# Patient Record
Sex: Female | Born: 2000 | Hispanic: Yes | Marital: Single | Smoking: Never smoker
Health system: Southern US, Community
[De-identification: ages and names within clinical notes are randomized; demographics above are authoritative.]

## PROBLEM LIST (undated history)

## (undated) DIAGNOSIS — I10 Essential (primary) hypertension: Secondary | ICD-10-CM

---

## 2021-07-16 ENCOUNTER — Inpatient Hospital Stay (HOSPITAL_COMMUNITY)
Admission: AD | Admit: 2021-07-16 | Discharge: 2021-07-16 | Disposition: A | Discharge status: Home / Self Care | Payer: Self-pay | Attending: Obstetrics & Gynecology | Admitting: Obstetrics & Gynecology

## 2021-07-16 ENCOUNTER — Ambulatory Visit (HOSPITAL_COMMUNITY)
Admission: EM | Admit: 2021-07-16 | Discharge: 2021-07-16 | Disposition: A | Discharge status: Home / Self Care | Payer: Self-pay | Attending: Emergency Medicine | Admitting: Emergency Medicine

## 2021-07-16 ENCOUNTER — Ambulatory Visit (INDEPENDENT_AMBULATORY_CARE_PROVIDER_SITE_OTHER): Payer: Self-pay

## 2021-07-16 ENCOUNTER — Encounter (HOSPITAL_COMMUNITY): Payer: Self-pay

## 2021-07-16 ENCOUNTER — Other Ambulatory Visit: Payer: Self-pay

## 2021-07-16 DIAGNOSIS — K5904 Chronic idiopathic constipation: Secondary | ICD-10-CM

## 2021-07-16 DIAGNOSIS — R1030 Lower abdominal pain, unspecified: Secondary | ICD-10-CM

## 2021-07-16 DIAGNOSIS — Z3202 Encounter for pregnancy test, result negative: Secondary | ICD-10-CM

## 2021-07-16 DIAGNOSIS — R1012 Left upper quadrant pain: Secondary | ICD-10-CM

## 2021-07-16 HISTORY — DX: Essential (primary) hypertension: I10

## 2021-07-16 LAB — POCT URINALYSIS DIPSTICK, ED / UC
Bilirubin Urine: NEGATIVE
Glucose, UA: NEGATIVE mg/dL
Ketones, ur: NEGATIVE mg/dL
Leukocytes,Ua: NEGATIVE
Nitrite: NEGATIVE
Protein, ur: NEGATIVE mg/dL
Specific Gravity, Urine: >=1.03 (ref 1.005–1.030)
Urobilinogen, UA: 0.2 mg/dL (ref 0.0–1.0)
pH: 6 (ref 5.0–8.0)

## 2021-07-16 LAB — POCT PREGNANCY, URINE: Preg Test, Ur: NEGATIVE

## 2021-07-16 LAB — HCG, QUANTITATIVE, PREGNANCY: hCG, Beta Chain, Quant, S: <1 m[IU]/mL (ref <5)

## 2021-07-16 MED ORDER — POLYETHYLENE GLYCOL 3350 17 G PO PACK: 17.0000 g | PACK | Freq: Every day | ORAL | 0 refills | Status: AC

## 2021-07-16 NOTE — ED Triage Notes (Signed)
Pt reports L lower abdominal pain X 2 weeks.  ? ? ?[facility]. [facility] and had blood work done that tested for pregnancy. Pt denies other pain and sxs.  ?

## 2021-07-16 NOTE — MAU Provider Note (Signed)
S ?[name] is a 21 y.o. No obstetric history on file. female who presents to MAU today with complaint of LAP in setting of +HPT. Reports 1 positive and 2 negative pregnancy test about 10 days ago. LMP was about 6 mos ago. Reports irregular periods since she was younger. ? ?ROS: ?+abd pain ? ?O ?BP 128/64 (BP Location: Right Arm)   Pulse 87   Temp 98 ?F (36.7 ?C) (Oral)   Resp 16   LMP  (LMP Unknown)   SpO2 99% Comment: room air ?Physical Exam ?Vitals and nursing note reviewed.  ?Constitutional:   ?   General: She is not in acute distress (appears comfortable). ?   Appearance: Normal appearance.  ?HENT:  ?   Head: Normocephalic and atraumatic.  ?Cardiovascular:  ?   Rate and Rhythm: Normal rate.  ?Pulmonary:  ?   Effort: Pulmonary effort is normal. No respiratory distress.  ?Musculoskeletal:     ?   General: Normal range of motion.  ?   Cervical back: Normal range of motion.  ?Neurological:  ?   General: No focal deficit present.  ?   Mental Status: She is alert and oriented to person, place, and time.  ?Psychiatric:     ?   Mood and Affect: Mood normal.     ?   Behavior: Behavior normal.  ? ?Results for orders placed or performed during the hospital encounter of 07/16/21 (from the past 24 hour(s))  ?Pregnancy, urine POC     Status: None  ? Collection Time: 07/16/21  3:49 PM  ?Result Value Ref Range  ? Preg Test, Ur NEGATIVE NEGATIVE  ?hCG, quantitative, pregnancy     Status: None  ? Collection Time: 07/16/21  4:05 PM  ?Result Value Ref Range  ? hCG, Beta Chain, Quant, S <1 <5 mIU/mL  ? ?MDM: Labs ordered. Negative UPT, will order qhcg.  ?No signs of pregnancy. Offered transfer to ED or UC, pt is undecided and will discuss with partner. Stable for discharge. ? ?A ?1. Negative pregnancy test   ?2. Lower abdominal pain   ? ?P ?Discharge from MAU in stable condition ?Warning signs for worsening condition that would warrant emergency follow-up discussed ?Patient may return to MAU as needed for  pregnancy related complaints ?Follow up at [facility] as scheduled ? ?Spanish interpreter present for all interactions ?Donette Larry, CNM ?07/16/2021 5:41 PM  ? ?

## 2021-07-16 NOTE — ED Provider Notes (Addendum)
[facility]    CSN: [id number] Arrival date & time: 07/16/21  1747    HISTORY   Chief Complaint  Patient presents with   Abdominal Pain   HPI Kathryn Carroll is a 21 y.o. female. Patient complains of lower abdominal pain for 2 weeks.  Patient was seen at the MAU earlier today for chief complaint of 1 positive pregnancy test and 2 negative pregnancy tests at home.  UPT and serum hCG performed at MAU today were both negative for pregnancy.  Patient reports irregular menstrual cycle, last menstrual period was January 16, 2021.  Patient states she is currently sexually active and not using any form of birth control.  Urine dip today was positive for trace amount of blood.  The history is provided by the patient.  Past Medical History:  Diagnosis Date   Hypertension    There are no problems to display for this patient.  History reviewed. No pertinent surgical history. OB History   No obstetric history on file.    Home Medications    Prior to Admission medications   Not on File    Family History Family History  Problem Relation Age of Onset   Healthy Mother    Healthy Father    Social History Social History   Tobacco Use   Smoking status: Never   Smokeless tobacco: Never  Vaping Use   Vaping Use: Never used  Substance Use Topics   Alcohol use: Yes   Drug use: Never   Allergies   Patient has no known allergies.  Review of Systems Review of Systems Pertinent findings noted in history of present illness.   Physical Exam Triage Vital Signs ED Triage Vitals  Enc Vitals Group     BP 03/14/21 0827 (!) 147/82     Pulse Rate 03/14/21 0827 72     Resp 03/14/21 0827 18     Temp 03/14/21 0827 98.3 F (36.8 C)     Temp Source 03/14/21 0827 Oral     SpO2 03/14/21 0827 98 %     Weight --      Height --      Head Circumference --      Peak Flow --      Pain Score 03/14/21 0826 5     Pain Loc --      Pain Edu? --      Excl. in GC? --   No  data found.  Updated Vital Signs BP (!) 138/93 (BP Location: Left Arm)    Pulse 86    Temp 98 F (36.7 C) (Oral)    Resp 17    LMP 01/16/2021 (Approximate)    SpO2 100%   Physical Exam Vitals and nursing note reviewed.  Constitutional:      General: She is not in acute distress.    Appearance: Normal appearance. She is not ill-appearing.  HENT:     Head: Normocephalic and atraumatic.  Eyes:     General: Lids are normal.        Right eye: No discharge.        Left eye: No discharge.     Extraocular Movements: Extraocular movements intact.     Conjunctiva/sclera: Conjunctivae normal.     Right eye: Right conjunctiva is not injected.     Left eye: Left conjunctiva is not injected.  Neck:     Trachea: Trachea and phonation normal.  Cardiovascular:     Rate and Rhythm: Normal rate and regular rhythm.  Pulses: Normal pulses.     Heart sounds: Normal heart sounds. No murmur heard.   No friction rub. No gallop.  Pulmonary:     Effort: Pulmonary effort is normal. No accessory muscle usage, prolonged expiration or respiratory distress.     Breath sounds: Normal breath sounds. No stridor, decreased air movement or transmitted upper airway sounds. No decreased breath sounds, wheezing, rhonchi or rales.  Chest:     Chest wall: No tenderness.  Abdominal:     General: Abdomen is flat. Bowel sounds are normal. There is no distension.     Palpations: Abdomen is soft.     Tenderness: There is abdominal tenderness in the left upper quadrant and left lower quadrant. There is no right CVA tenderness or left CVA tenderness.     Hernia: No hernia is present.  Musculoskeletal:        General: Normal range of motion.     Cervical back: Normal range of motion and neck supple. Normal range of motion.  Lymphadenopathy:     Cervical: No cervical adenopathy.  Skin:    General: Skin is warm and dry.     Findings: No erythema or rash.  Neurological:     General: No focal deficit present.      Mental Status: She is alert and oriented to person, place, and time.  Psychiatric:        Mood and Affect: Mood normal.        Behavior: Behavior normal.    Visual Acuity Right Eye Distance:   Left Eye Distance:   Bilateral Distance:    Right Eye Near:   Left Eye Near:    Bilateral Near:     UC Couse / Diagnostics / Procedures:    EKG  Radiology DG Abd 1 View  Result Date: 07/16/2021 CLINICAL DATA:  LEFT side lower abdominal pain for 2 weeks, negative pregnancy test EXAM: ABDOMEN - 1 VIEW COMPARISON:  None FINDINGS: Scattered stool throughout colon and rectum. Nonobstructive bowel gas pattern. No bowel dilatation or bowel wall thickening. Osseous structures unremarkable. No urine tract calcification. IMPRESSION: No acute abnormalities. Electronically Signed   By: Ulyses Southward M.D.   On: 07/16/2021 19:35    Procedures Procedures (including critical care time)  UC Diagnoses / Final Clinical Impressions(s)   I have reviewed the triage vital signs and the nursing notes.  Pertinent labs & imaging results that were available during my care of the patient were reviewed by me and considered in my medical decision making (see chart for details).    Final diagnoses:  Chronic idiopathic constipation  Left upper quadrant abdominal pain   Significant stool burden appreciated on plain abdominal film today.  Patient advised to begin MiraLAX.  Prescription sent to pharmacy.  Patient provided with contact information for [facility] because she does not have insurance.  ED Prescriptions     Medication Sig Dispense Auth. Provider   polyethylene glycol (MIRALAX / GLYCOLAX) 17 g packet Take 17 g by mouth daily. 100 packet Theadora Rama Scales, [place]      PDMP not reviewed this encounter.  Pending results:  Labs Reviewed  POCT URINALYSIS DIPSTICK, ED / UC - Abnormal; Notable for the following components:      Result Value   Hgb urine dipstick TRACE (*)    All  other components within normal limits    Medications Ordered in UC: Medications - No data to display  Disposition Upon Discharge:  Condition: stable for  discharge home Home: take medications as prescribed; routine discharge instructions as discussed; follow up as advised.  Patient presented with an acute illness with associated systemic symptoms and significant discomfort requiring urgent management. In my opinion, this is a condition that a prudent lay person (someone who possesses an average knowledge of health and medicine) may potentially expect to result in complications if not addressed urgently such as respiratory distress, impairment of bodily function or dysfunction of bodily organs.   Routine symptom specific, illness specific and/or disease specific instructions were discussed with the patient and/or caregiver at length.   As such, the patient has been evaluated and assessed, work-up was performed and treatment was provided in alignment with urgent care protocols and evidence based medicine.  Patient/parent/caregiver has been advised that the patient may require follow up for further testing and treatment if the symptoms continue in spite of treatment, as clinically indicated and appropriate.  If the patient was tested for COVID-19, Influenza and/or RSV, then the patient/parent/guardian was advised to isolate at home pending the results of his/her diagnostic coronavirus test and potentially longer if theyre positive. I have also advised pt that if his/her COVID-19 test returns positive, it's recommended to self-isolate for at least 10 days after symptoms first appeared AND until fever-free for 24 hours without fever reducer AND other symptoms have improved or resolved. Discussed self-isolation recommendations as well as instructions for household member/close contacts as per the [facility]CDC and [place] DHHS, and also gave patient the COVID packet with this information.  Patient/parent/caregiver has  been [facility] 3-5 days if no better; to PCP or the Emergency Department if new signs and symptoms develop, or if the current signs or symptoms continue to change or worsen for further workup, evaluation and treatment as clinically indicated and appropriate  The patient will follow up with their current PCP if and as advised. If the patient does not currently have a PCP we will assist them in obtaining one.   The patient may need specialty follow up if the symptoms continue, in spite of conservative treatment and management, for further workup, evaluation, consultation and treatment as clinically indicated and appropriate.   Patient/parent/caregiver verbalized understanding and agreement of plan as discussed.  All questions were addressed during visit.  Please see discharge instructions below for further details of plan.  Discharge Instructions:   Discharge Instructions      Usted est sufriendo de estreimiento significativo en este momento, que es la fuente ms probable de su dolor abdominal del lado izquierdo.  Hay una gran cantidad de gas respaldado alrededor de una gran cantidad de heces en el abdomen, el gas causa la expansin de los intestinos, lo que crea mucho dolor.  Para tratar esto, le recomiendo que comience a tomar MiraLAX todas las [place], 1 tapa llena del polvo MiraLAX mezclado en 8 onzas de lquido que puede ser [place], [place], [place], t o cualquier otra cosa que le guste beber por la [place].  Contine tomando [employer] Technologies[employer]iraLAX todos los das durante 3 meses completos, no se salte un da porque se sienta mejor.  La dosificacin diaria constante de MiraLAX es esencial para ayudar a volver a entrenar sus intestinos para moverse de manera ms eficiente.  Por favor, tambin trabaje en agregar ms fibra a su dieta.  He adjuntado informacin sobre qu alimentos contienen ms fibra, asegrese de que la mitad de lo que est comiendo est en esta lista de alimentos ricos  en [place].  Le proporcion informacin sobre una prctica familiar local que atiende a pacientes que no tienen seguro y es de bajo costo en funcin de sus ingresos y capacidad de pago.  Pngase en contacto con ellos maana para programar una cita inicial como nuevo paciente.  Gracias por visitar la atencin de urgencia hoy.  Espero que te sientas mejor pronto.   You are suffering from significant constipation at this time which is the most likely source of your left-sided abdominal pain.  There is a lot of gas backed up around a large amount of stool in your abdomen, the gas causes expansion of your intestines which creates a lot of pain.  To treat this, I recommend that you begin taking MiraLAX every morning, 1 capful of the MiraLAX powder mixed into 8 ounces of liquid which can be juice, coffee, water, tea or anything else you enjoy drinking in the morning.  Please continue taking MiraLAX every day for 3 full months, do not skip a day because you are feeling better.  Consistent daily dosing of MiraLAX is essential to help retrain your bowels to move more efficiently.  Please also work on adding more fiber to your diet.  I have enclosed information regarding which foods contain more fiber, please make sure that half of what you are eating is on this high-fiber food list.  I provided you with information regarding a local family practice that sees patients who do not have insurance and is low cost based on your income and ability to pay.  Please contact them tomorrow to set up an initial appointment as a new patient.  Thank you for visiting urgent care today.  I hope you feel better soon.     This office note has been dictated using Teaching laboratory technician.  Unfortunately, and despite my best efforts, this method of dictation can sometimes lead to occasional typographical or grammatical errors.  I apologize in advance if this occurs.     Theadora Rama Scales, PA-C 07/16/21  1943    Theadora Rama Scales, PA-C 07/16/21 1944

## 2021-07-16 NOTE — MAU Note (Signed)
Kathryn Carroll is a 21 y.o. here in MAU reporting: lower abdominal pain for the past week and today it has gotten worse. No vaginal bleeding or abnormal discharge. Has had some + UPT and some neg. ? ?LMP: irregular, states it has been a while ? ?Onset of complaint: ongoing ? ?Pain score: 8/10 ? ?Vitals:  ? 07/16/21 1552  ?BP: 128/64  ?Pulse: 87  ?Resp: 16  ?Temp: 98 ?F (36.7 ?C)  ?SpO2: 99%  ?   ?Lab orders placed from triage: upt ? ?

## 2021-07-16 NOTE — Discharge Instructions (Addendum)
Usted est? sufriendo de estre?imiento significativo en este momento, que es la fuente m?s probable de su dolor abdominal del lado izquierdo.  Hay una gran cantidad de gas respaldado alrededor de una gran cantidad de heces en el abdomen, el gas causa la expansi?n de los intestinos, lo que crea mucho dolor. ? ?Para tratar esto, le recomiendo que comience a tomar MiraLAX todas las ma?anas, 1 tapa llena del polvo MiraLAX mezclado en 8 onzas de l?quido que puede ser [place], caf?, agua, t? o cualquier otra cosa que le guste beber por la ma?ana. ? ?Contin?e tomando MiraLAX todos los d?as durante 3 meses completos, no se salte un d?a porque se sienta mejor.  La dosificaci?n diaria constante de MiraLAX es esencial para ayudar a volver a entrenar sus intestinos para moverse de manera m?s eficiente. ? ?Por favor, tambi?n trabaje en agregar m?s fibra a su dieta.  He adjuntado informaci?n sobre qu? alimentos contienen m?s fibra, aseg?rese de que la mitad de lo que est? comiendo est? en esta lista de alimentos ricos en fibra. ? ?Le proporcion? informaci?n sobre una pr?ctica familiar local que atiende a pacientes que no tienen seguro y es de bajo costo en funci?n de sus ingresos y capacidad de Psychiatric nurse.  P?ngase en contacto con ellos ma?ana para programar una cita inicial como nuevo paciente. ? ?Gracias por visitar la atenci?n de urgencia hoy.  Espero que te sientas mejor pronto. ? ? ?You are suffering from significant constipation at this time which is the most likely source of your left-sided abdominal pain.  There is a lot of gas backed up around a large amount of stool in your abdomen, the gas causes expansion of your intestines which creates a lot of pain. ? ?To treat this, I recommend that you begin taking MiraLAX every morning, 1 capful of the MiraLAX powder mixed into 8 ounces of liquid which can be juice, coffee, water, tea or anything else you enjoy drinking in the morning. ? ?Please continue taking MiraLAX every day for 3  full months, do not skip a day because you are feeling better.  Consistent daily dosing of MiraLAX is essential to help retrain your bowels to move more efficiently. ? ?Please also work on adding more fiber to your diet.  I have enclosed information regarding which foods contain more fiber, please make sure that half of what you are eating is on this high-fiber food list. ? ?I provided you with information regarding a local family practice that sees patients who do not have insurance and is low cost based on your income and ability to pay.  Please contact them tomorrow to set up an initial appointment as a new patient. ? ?Thank you for visiting urgent care today.  I hope you feel better soon. ? ?

## 2023-02-23 IMAGING — DX DG ABDOMEN 1V
1 series · 1 of 1 positions shown · non-contrast
Comparison: None

CLINICAL DATA: LEFT side lower abdominal pain for 2 weeks, negative
pregnancy test

EXAM:
ABDOMEN - 1 VIEW

[abdomen kub]
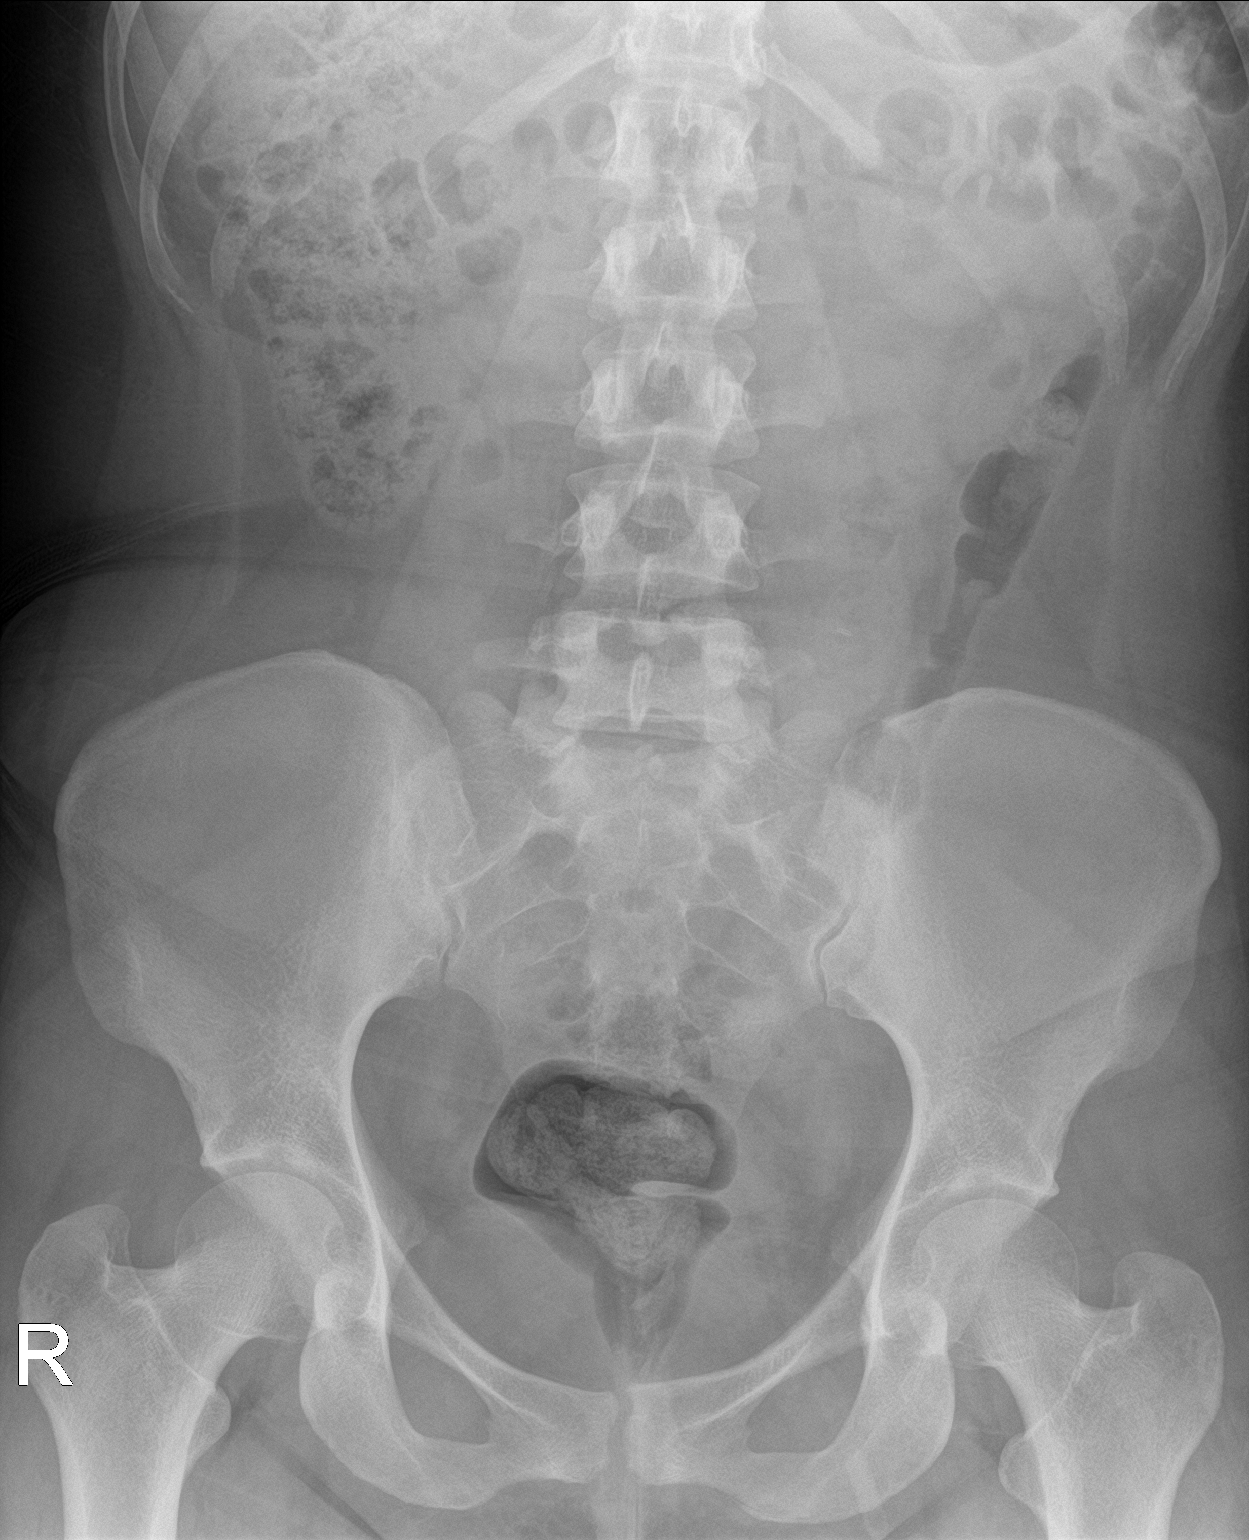

[1 of 1 positions shown; findings below may reference images not displayed]

FINDINGS: Scattered stool throughout colon and rectum.

Nonobstructive bowel gas pattern.

No bowel dilatation or bowel wall thickening.

Osseous structures unremarkable.

No urine tract calcification.
IMPRESSION: No acute abnormalities.

## 2023-09-30 ENCOUNTER — Other Ambulatory Visit: Payer: Self-pay

## 2023-09-30 ENCOUNTER — Encounter (HOSPITAL_COMMUNITY): Payer: Self-pay | Admitting: Emergency Medicine

## 2023-09-30 ENCOUNTER — Emergency Department (HOSPITAL_COMMUNITY)
Admission: EM | Admit: 2023-09-30 | Discharge: 2023-10-01 | Discharge status: Against Medical Advice | Payer: Self-pay | Attending: Emergency Medicine | Admitting: Emergency Medicine

## 2023-09-30 DIAGNOSIS — Z5321 Procedure and treatment not carried out due to patient leaving prior to being seen by health care provider: Secondary | ICD-10-CM | POA: Insufficient documentation

## 2023-09-30 DIAGNOSIS — R1032 Left lower quadrant pain: Secondary | ICD-10-CM | POA: Insufficient documentation

## 2023-09-30 LAB — COMPREHENSIVE METABOLIC PANEL WITH GFR
ALT: 26 U/L (ref 0–44)
AST: 23 U/L (ref 15–41)
Albumin: 3.9 g/dL (ref 3.5–5.0)
Alkaline Phosphatase: 55 U/L (ref 38–126)
Anion gap: 9 (ref 5–15)
BUN: 9 mg/dL (ref 6–20)
CO2: 23 mmol/L (ref 22–32)
Calcium: 9.2 mg/dL (ref 8.9–10.3)
Chloride: 105 mmol/L (ref 98–111)
Creatinine, Ser: 0.63 mg/dL (ref 0.44–1.00)
GFR, Estimated: >60 mL/min (ref >60)
Glucose, Bld: 107 mg/dL — ABNORMAL HIGH (ref 70–99)
Potassium: 3.5 mmol/L (ref 3.5–5.1)
Sodium: 137 mmol/L (ref 135–145)
Total Bilirubin: 0.9 mg/dL (ref 0.0–1.2)
Total Protein: 7.6 g/dL (ref 6.5–8.1)

## 2023-09-30 LAB — URINALYSIS, ROUTINE W REFLEX MICROSCOPIC
Bilirubin Urine: NEGATIVE
Glucose, UA: NEGATIVE mg/dL
Hgb urine dipstick: NEGATIVE
Ketones, ur: 5 mg/dL — AB
Leukocytes,Ua: NEGATIVE
Nitrite: NEGATIVE
Protein, ur: NEGATIVE mg/dL
Specific Gravity, Urine: 1.03 (ref 1.005–1.030)
pH: 6 (ref 5.0–8.0)

## 2023-09-30 LAB — CBC
HCT: 37.9 % (ref 36.0–46.0)
Hemoglobin: 12.9 g/dL (ref 12.0–15.0)
MCH: 29.3 pg (ref 26.0–34.0)
MCHC: 34 g/dL (ref 30.0–36.0)
MCV: 86.1 fL (ref 80.0–100.0)
Platelets: 282 10*3/uL (ref 150–400)
RBC: 4.4 MIL/uL (ref 3.87–5.11)
RDW: 12 % (ref 11.5–15.5)
WBC: 8.3 10*3/uL (ref 4.0–10.5)
nRBC: 0 % (ref 0.0–0.2)

## 2023-09-30 LAB — LIPASE, BLOOD: Lipase: 29 U/L (ref 11–51)

## 2023-09-30 LAB — HCG, SERUM, QUALITATIVE: Preg, Serum: NEGATIVE

## 2023-09-30 NOTE — ED Provider Triage Note (Signed)
 Emergency Medicine Provider Triage Evaluation Note  Kathryn Carroll , a 23 y.o. female  was evaluated in triage.  Pt complains of here for LLQ pain since December, now worse. .  Review of Systems  Positive: LLQ pain  Negative: fever  Physical Exam  BP 132/85   Pulse 83   Temp 99.6 F (37.6 C)   Resp 18   Wt 68 kg   LMP 09/09/2023 (Exact Date)   SpO2 100%  Gen:   Awake, no distress   Resp:  Normal effort  MSK:   Moves extremities without difficulty  Other:    Medical Decision Making  Medically screening exam initiated at 9:12 PM.  Appropriate orders placed.  Kathryn Carroll was informed that the remainder of the evaluation will be completed by another provider, this initial triage assessment does not replace that evaluation, and the importance of remaining in the ED until their evaluation is complete.     Tama Fails, PA-C 09/30/23 2112

## 2023-09-30 NOTE — ED Triage Notes (Signed)
 Spanish interpreter used during triage - pt reports pain to LLQ ongoing since December, but worsened since Friday. Denies any fevers, reports +nausea and poor appetite. LMP 4/24, but states she has had irregular periods x 6mos. Denies any vaginal bleeding/discharge or urinary symptoms

## 2023-10-01 ENCOUNTER — Emergency Department (HOSPITAL_COMMUNITY)
Admission: EM | Admit: 2023-10-01 | Discharge: 2023-10-01 | Disposition: A | Discharge status: Home / Self Care | Payer: Self-pay | Attending: Emergency Medicine | Admitting: Emergency Medicine

## 2023-10-01 ENCOUNTER — Emergency Department (HOSPITAL_COMMUNITY): Payer: Self-pay

## 2023-10-01 DIAGNOSIS — R509 Fever, unspecified: Secondary | ICD-10-CM | POA: Insufficient documentation

## 2023-10-01 DIAGNOSIS — R1032 Left lower quadrant pain: Secondary | ICD-10-CM | POA: Insufficient documentation

## 2023-10-01 DIAGNOSIS — R112 Nausea with vomiting, unspecified: Secondary | ICD-10-CM | POA: Insufficient documentation

## 2023-10-01 LAB — URINALYSIS, ROUTINE W REFLEX MICROSCOPIC
Bilirubin Urine: NEGATIVE
Glucose, UA: NEGATIVE mg/dL
Hgb urine dipstick: NEGATIVE
Ketones, ur: NEGATIVE mg/dL
Leukocytes,Ua: NEGATIVE
Nitrite: NEGATIVE
Protein, ur: NEGATIVE mg/dL
Specific Gravity, Urine: 1.019 (ref 1.005–1.030)
pH: 7 (ref 5.0–8.0)

## 2023-10-01 LAB — CBC WITH DIFFERENTIAL/PLATELET
Abs Immature Granulocytes: 0.02 10*3/uL (ref 0.00–0.07)
Basophils Absolute: 0 10*3/uL (ref 0.0–0.1)
Basophils Relative: 1 %
Eosinophils Absolute: 0.6 10*3/uL — ABNORMAL HIGH (ref 0.0–0.5)
Eosinophils Relative: 9 %
HCT: 37.8 % (ref 36.0–46.0)
Hemoglobin: 13.1 g/dL (ref 12.0–15.0)
Immature Granulocytes: 0 %
Lymphocytes Relative: 37 %
Lymphs Abs: 2.5 10*3/uL (ref 0.7–4.0)
MCH: 29.8 pg (ref 26.0–34.0)
MCHC: 34.7 g/dL (ref 30.0–36.0)
MCV: 85.9 fL (ref 80.0–100.0)
Monocytes Absolute: 0.5 10*3/uL (ref 0.1–1.0)
Monocytes Relative: 7 %
Neutro Abs: 3.2 10*3/uL (ref 1.7–7.7)
Neutrophils Relative %: 46 %
Platelets: 281 10*3/uL (ref 150–400)
RBC: 4.4 MIL/uL (ref 3.87–5.11)
RDW: 12.1 % (ref 11.5–15.5)
WBC: 6.8 10*3/uL (ref 4.0–10.5)
nRBC: 0 % (ref 0.0–0.2)

## 2023-10-01 LAB — COMPREHENSIVE METABOLIC PANEL WITH GFR
ALT: 21 U/L (ref 0–44)
AST: 29 U/L (ref 15–41)
Albumin: 3.7 g/dL (ref 3.5–5.0)
Alkaline Phosphatase: 50 U/L (ref 38–126)
Anion gap: 8 (ref 5–15)
BUN: 9 mg/dL (ref 6–20)
CO2: 24 mmol/L (ref 22–32)
Calcium: 9 mg/dL (ref 8.9–10.3)
Chloride: 104 mmol/L (ref 98–111)
Creatinine, Ser: 0.58 mg/dL (ref 0.44–1.00)
GFR, Estimated: >60 mL/min (ref >60)
Glucose, Bld: 100 mg/dL — ABNORMAL HIGH (ref 70–99)
Potassium: 4.2 mmol/L (ref 3.5–5.1)
Sodium: 136 mmol/L (ref 135–145)
Total Bilirubin: 1.4 mg/dL — ABNORMAL HIGH (ref 0.0–1.2)
Total Protein: 7.3 g/dL (ref 6.5–8.1)

## 2023-10-01 LAB — HCG, SERUM, QUALITATIVE: Preg, Serum: NEGATIVE

## 2023-10-01 LAB — LIPASE, BLOOD: Lipase: 28 U/L (ref 11–51)

## 2023-10-01 MED ORDER — SODIUM CHLORIDE 0.9 % IV BOLUS
1000.0000 mL | Freq: Once | INTRAVENOUS | Status: AC
  Administered 2023-10-01: 1000 mL via INTRAVENOUS

## 2023-10-01 MED ORDER — MORPHINE SULFATE (PF) 4 MG/ML IV SOLN
4.0000 mg | Freq: Once | INTRAVENOUS | Status: AC
  Administered 2023-10-01: 4 mg via INTRAVENOUS
  Filled 2023-10-01: qty 1

## 2023-10-01 MED ORDER — ONDANSETRON HCL 4 MG/2ML IJ SOLN
4.0000 mg | Freq: Once | INTRAMUSCULAR | Status: AC
  Administered 2023-10-01: 4 mg via INTRAVENOUS
  Filled 2023-10-01: qty 2

## 2023-10-01 MED ORDER — IOHEXOL 350 MG/ML SOLN
75.0000 mL | Freq: Once | INTRAVENOUS | Status: AC | PRN
  Administered 2023-10-01: 75 mL via INTRAVENOUS

## 2023-10-01 NOTE — ED Provider Notes (Signed)
 [place] EMERGENCY DEPARTMENT AT [facility] Provider Note   CSN: [id number] Arrival date & time: 10/01/23  4098     History  Chief Complaint  Patient presents with   Abdominal Pain    Kathryn Carroll is a 23 y.o. female.  23 year old female with no past medical history presents to the ED with a chief complaint of left lower quadrant pain for 1 week. She describes this as stabbing pain that waxes and wanes exacerbated after she eats, improved when she is not eating.  She was evaluated at a clinic on Tuesday, reports she had a negative pregnancy test, negative urine workup.  Reports her pain has continued since.  Her last bowel movement was this morning and it was normal.  Also endorses some nausea and vomiting which occurred about a week ago.  She also reports a subjective fever on Friday.  No fever, no blood in her stool, no vaginal discharge, no concern for pregnancy. Does have a Depo-Provera for birth control.   The history is provided by the patient.  Abdominal Pain Associated symptoms: nausea and vomiting   Associated symptoms: no chest pain, no chills, no fever, no shortness of breath, no sore throat, no vaginal bleeding and no vaginal discharge        Home Medications Prior to Admission medications   Not on File      Allergies    Patient has no known allergies.    Review of Systems   Review of Systems  Constitutional:  Negative for chills and fever.  HENT:  Negative for sore throat.   Respiratory:  Negative for shortness of breath.   Cardiovascular:  Negative for chest pain.  Gastrointestinal:  Positive for abdominal pain, nausea and vomiting.  Genitourinary:  Negative for difficulty urinating, urgency, vaginal bleeding and vaginal discharge.  Musculoskeletal:  Negative for back pain.  All other systems reviewed and are negative.   Physical Exam Updated Vital Signs BP (!) 149/98   Pulse 86   Temp 98.3 F (36.8 C)   Resp 16   LMP  09/09/2023 (Exact Date)   SpO2 100%  Physical Exam Vitals and nursing note reviewed.  Constitutional:      General: She is not in acute distress.    Appearance: She is well-developed.  HENT:     Head: Normocephalic and atraumatic.     Mouth/Throat:     Pharynx: No oropharyngeal exudate.  Eyes:     Pupils: Pupils are equal, round, and reactive to light.  Cardiovascular:     Rate and Rhythm: Regular rhythm.     Heart sounds: Normal heart sounds.  Pulmonary:     Effort: Pulmonary effort is normal. No respiratory distress.     Breath sounds: Normal breath sounds.  Abdominal:     General: Bowel sounds are normal. There is no distension.     Palpations: Abdomen is soft.     Tenderness: There is abdominal tenderness in the left lower quadrant. There is no right CVA tenderness or left CVA tenderness.  Musculoskeletal:        General: No tenderness or deformity.     Cervical back: Normal range of motion.     Right lower leg: No edema.     Left lower leg: No edema.  Skin:    General: Skin is warm and dry.  Neurological:     Mental Status: She is alert and oriented to person, place, and time.     ED Results /  Procedures / Treatments   Labs (all labs ordered are listed, but only abnormal results are displayed) Labs Reviewed  CBC WITH DIFFERENTIAL/PLATELET - Abnormal; Notable for the following components:      Result Value   Eosinophils Absolute 0.6 (*)    All other components within normal limits  COMPREHENSIVE METABOLIC PANEL WITH GFR - Abnormal; Notable for the following components:   Glucose, Bld 100 (*)    Total Bilirubin 1.4 (*)    All other components within normal limits  URINALYSIS, ROUTINE W REFLEX MICROSCOPIC - Abnormal; Notable for the following components:   APPearance HAZY (*)    All other components within normal limits  LIPASE, BLOOD  HCG, SERUM, QUALITATIVE    EKG None  Radiology CT ABDOMEN PELVIS W CONTRAST Result Date: 10/01/2023 CLINICAL DATA:  LLQ  abdominal pain. EXAM: CT ABDOMEN AND PELVIS WITH CONTRAST TECHNIQUE: Multidetector CT imaging of the abdomen and pelvis was performed using the standard protocol following bolus administration of intravenous contrast. RADIATION DOSE REDUCTION: This exam was performed according to the departmental dose-optimization program which includes automated exposure control, adjustment of the mA and/or kV according to patient size and/or use of iterative reconstruction technique. CONTRAST:  75mL OMNIPAQUE IOHEXOL 350 MG/ML SOLN COMPARISON:  None Available. FINDINGS: Lower chest: The lung bases are clear. No pleural effusion. The heart is normal in size. No pericardial effusion. Hepatobiliary: The liver is normal in size. Non-cirrhotic configuration. No suspicious mass. No intrahepatic or extrahepatic bile duct dilation. No calcified gallstones. Normal gallbladder wall thickness. No pericholecystic inflammatory changes. Pancreas: Unremarkable. No pancreatic ductal dilatation or surrounding inflammatory changes. Spleen: Within normal limits. No focal lesion. Adrenals/Urinary Tract: Adrenal glands are unremarkable. No suspicious renal mass. There is a 6 x 10 mm simple cyst in the right kidney upper pole, anteriorly. No hydronephrosis. No renal or ureteric calculi. Unremarkable urinary bladder. Stomach/Bowel: No disproportionate dilation of the small or large bowel loops. No evidence of abnormal bowel wall thickening or inflammatory changes. The appendix is unremarkable. Vascular/Lymphatic: No ascites or pneumoperitoneum. No abdominal or pelvic lymphadenopathy, by size criteria. No aneurysmal dilation of the major abdominal arteries. Reproductive: The uterus is unremarkable. The ovaries are unremarkable. Other: There is a tiny fat containing umbilical hernia. The soft tissues and abdominal wall are otherwise unremarkable. Musculoskeletal: No suspicious osseous lesions. IMPRESSION: *No acute inflammatory process identified within  the abdomen or pelvis. *Multiple other nonacute observations, as described above. Electronically Signed   By: Beula Brunswick M.D.   On: 10/01/2023 11:39    Procedures Procedures    Medications Ordered in ED Medications  sodium chloride 0.9 % bolus 1,000 mL ( Intravenous Infusion Verify 10/01/23 1058)  morphine (PF) 4 MG/ML injection 4 mg (4 mg Intravenous Given 10/01/23 1023)  ondansetron (ZOFRAN) injection 4 mg (4 mg Intravenous Given 10/01/23 1023)  iohexol (OMNIPAQUE) 350 MG/ML injection 75 mL (75 mLs Intravenous Contrast Given 10/01/23 1131)    ED Course/ Medical Decision Making/ A&P                                 Medical Decision Making Amount and/or Complexity of Data Reviewed Labs: ordered. Radiology: ordered.  Risk Prescription drug management.    This patient presents to the ED for concern of abdominal pain, this involves a number of treatment options, and is a complaint that carries with it a high risk of complications and morbidity.  The differential diagnosis includes diverticulitis  constipation, ovarian torsion, pregnancy.   Co morbidities: Discussed in HPI   Brief History:  See HPI.  EMR reviewed including pt PMHx, past surgical history and past visits to ER.   See HPI for more details   Lab Tests:  I ordered and independently interpreted labs.  The pertinent results include:    I personally reviewed all laboratory work and imaging. Metabolic panel without any acute abnormality specifically kidney function within normal limits and no significant electrolyte abnormalities. CBC without leukocytosis or significant anemia.  Imaging Studies:  NAD. I personally reviewed all imaging studies and no acute abnormality found. I agree with radiology interpretation.  Medicines ordered:  I ordered medication including morphine,zofran  for symptomatic treatment Reevaluation of the patient after these medicines showed that the patient improved I have reviewed the  patients home medicines and have made adjustments as needed  Reevaluation:  After the interventions noted above I re-evaluated patient and found that they have :resolved  Social Determinants of Health:  The patient's social determinants of health were a factor in the care of this patient   Problem List / ED Course:  Patient presents to the ED with a chief complaint of left lower quadrant pain that is ongoing for about a week.  Evaluated in the clinic on Tuesday, reported to have a negative urinary test negative pregnancy test.  Continues to have pain along the left lower quad exacerbated every time she eats.  Has not had any fevers, had 1 episode of nausea and vomiting pain for started but this has now resolved.  On evaluation abdomen is soft, there is no guarding or rebound noted, bowel sounds are distant.  Last oral intake was this morning with coffee. Interpretation of her blood work by me reveals CBC with no leukocytosis, hemoglobin normal limits.  CMP without electrolyte.  No CVA tenderness to suggest renal etiology. She did get a bolus, Zofran, morphine to help with symptomatic treatment with resolution in symptoms.  CT of her abdomen was obtained to rule out any diverticulitis versus appendicitis versus obstruction.  Although, her last bowel movement was this morning, she does have a visit to urgent care for constipation approximately 2 years ago after review of records. CT abdomen pelvis was without any acute findings at this time.  Discussed with patient all her results, she with a copy of this.  We dis,suspicion for viral etiology cussed no concerning vital signs during her stay, no nausea vomiting.  Return precautions discussed at length, patient is hemodynamically stable for discharge.  Dispostion:  After consideration of the diagnostic results and the patients response to treatment, I feel that the patent would benefit from continue to push fluids, and supportive therapy.      Portions of this note were generated with Scientist, clinical (histocompatibility and immunogenetics). Dictation errors may occur despite best attempts at proofreading.   Final Clinical Impression(s) / ED Diagnoses Final diagnoses:  Abdominal pain, LLQ (left lower quadrant)    Rx / DC Orders ED Discharge Orders     None         Luellen Sages, PA-C 10/01/23 1209    Afton Horse T, DO 10/01/23 [phone]

## 2023-10-01 NOTE — ED Triage Notes (Signed)
 Patient checked in last night for LLQ pain and nausea since December but worse x 1 week. Patient discharged out of system as she did not answer after multiple attempts to call her name, though she reports she never left. Labs and urine obtained last night.

## 2023-10-01 NOTE — ED Notes (Signed)
 Called patient numerous of times, no responce

## 2023-10-01 NOTE — Discharge Instructions (Addendum)
 Los resultados de sus examenes salieron normales hoy.  Puede que este surfriendo de un virus, continue tomando agua y tylenol o ibuprofen para sus simptomas.  Regrese a la sala de Manufacturing engineer.

## 2023-10-07 ENCOUNTER — Ambulatory Visit: Payer: Self-pay | Admitting: Obstetrics and Gynecology
